# Patient Record
Sex: Male | Born: 1995 | Hispanic: Yes | Smoking: Current every day smoker
Health system: Southern US, Community
[De-identification: ages and names within clinical notes are randomized; demographics above are authoritative.]

---

## 2021-06-21 ENCOUNTER — Ambulatory Visit
Admission: EM | Admit: 2021-06-21 | Discharge: 2021-06-21 | Disposition: A | Discharge status: Home / Self Care | Payer: Self-pay | Attending: Emergency Medicine | Admitting: Emergency Medicine

## 2021-06-21 ENCOUNTER — Encounter: Payer: Self-pay | Admitting: Emergency Medicine

## 2021-06-21 ENCOUNTER — Other Ambulatory Visit: Payer: Self-pay

## 2021-06-21 ENCOUNTER — Ambulatory Visit (INDEPENDENT_AMBULATORY_CARE_PROVIDER_SITE_OTHER): Payer: Self-pay

## 2021-06-21 DIAGNOSIS — S93401A Sprain of unspecified ligament of right ankle, initial encounter: Secondary | ICD-10-CM

## 2021-06-21 DIAGNOSIS — M79671 Pain in right foot: Secondary | ICD-10-CM

## 2021-06-21 DIAGNOSIS — M25571 Pain in right ankle and joints of right foot: Secondary | ICD-10-CM

## 2021-06-21 DIAGNOSIS — W19XXXA Unspecified fall, initial encounter: Secondary | ICD-10-CM

## 2021-06-21 MED ORDER — HYDROCODONE-ACETAMINOPHEN 5-325 MG PO TABS: 1.0000 | ORAL_TABLET | Freq: Four times a day (QID) | ORAL | 0 refills | Status: AC | PRN

## 2021-06-21 MED ORDER — IBUPROFEN 600 MG PO TABS: 600.0000 mg | ORAL_TABLET | Freq: Four times a day (QID) | ORAL | 0 refills | Status: AC | PRN

## 2021-06-21 NOTE — ED Provider Notes (Signed)
[facility]    CSN: [id number] Arrival date & time: 06/21/21  1332      History   Chief Complaint Chief Complaint  Patient presents with   Ankle Pain    right    HPI Jason Haley is a 25 y.o. male.   HPI  25 year old male here for evaluation of right foot ankle pain.  Patient reports that he fell from approximately 6 to 7 feet in the air onto a concrete surface.  He landed flat on his feet but his right foot also landed on a stick which caused the patient to roll his ankle inward.  He states he heard and felt a pop, had immediate pain, and he has not been able to bear weight since.  He also feels numbness and tingling in his toes.  History reviewed. No pertinent past medical history.  There are no problems to display for this patient.   History reviewed. No pertinent surgical history.     Home Medications    Prior to Admission medications   Medication Sig Start Date End Date Taking? Authorizing Provider  HYDROcodone-acetaminophen (NORCO/VICODIN) 5-325 MG tablet Take 1-2 tablets by mouth every 6 (six) hours as needed. 06/21/21  Yes Becky Augusta, NP  ibuprofen (ADVIL) 600 MG tablet Take 1 tablet (600 mg total) by mouth every 6 (six) hours as needed. 06/21/21  Yes Becky Augusta, NP    Family History No family history on file.  Social History Social History   Tobacco Use   Smoking status: Every Day    Packs/day: 1.00    Types: Cigarettes   Smokeless tobacco: Never  Vaping Use   Vaping Use: Never used  Substance Use Topics   Alcohol use: Yes   Drug use: Not Currently     Allergies   Patient has no known allergies.   Review of Systems Review of Systems  Constitutional:  Negative for activity change, appetite change and fever.  Musculoskeletal:  Positive for arthralgias, joint swelling and myalgias.  Skin:  Negative for color change and wound.  Neurological:  Positive for numbness. Negative for weakness.  Hematological: Negative.    Psychiatric/Behavioral: Negative.      Physical Exam Triage Vital Signs ED Triage Vitals  Enc Vitals Group     BP 06/21/21 1400 138/87     Pulse Rate 06/21/21 1400 99     Resp 06/21/21 1400 18     Temp 06/21/21 1400 98.8 F (37.1 C)     Temp Source 06/21/21 1400 Oral     SpO2 06/21/21 1400 100 %     Weight 06/21/21 1358 170 lb (77.1 kg)     Height 06/21/21 1358 5\' 8"  (1.727 m)     Head Circumference --      Peak Flow --      Pain Score 06/21/21 1358 8     Pain Loc --      Pain Edu? --      Excl. in GC? --    No data found.  Updated Vital Signs BP 138/87 (BP Location: Right Arm)   Pulse 99   Temp 98.8 F (37.1 C) (Oral)   Resp 18   Ht 5\' 8"  (1.727 m)   Wt 170 lb (77.1 kg)   SpO2 100%   BMI 25.85 kg/m   Visual Acuity Right Eye Distance:   Left Eye Distance:   Bilateral Distance:    Right Eye Near:   Left Eye Near:    Bilateral Near:  Physical Exam Vitals and nursing note reviewed.  Constitutional:      General: He is not in acute distress.    Appearance: Normal appearance. He is not ill-appearing.  HENT:     Head: Normocephalic and atraumatic.  Musculoskeletal:        General: Swelling and tenderness present. No deformity.  Skin:    General: Skin is warm and dry.     Capillary Refill: Capillary refill takes less than 2 seconds.     Findings: No erythema.  Neurological:     General: No focal deficit present.     Mental Status: He is alert and oriented to person, place, and time.  Psychiatric:        Mood and Affect: Mood normal.        Behavior: Behavior normal.        Thought Content: Thought content normal.        Judgment: Judgment normal.     UC Treatments / Results  Labs (all labs ordered are listed, but only abnormal results are displayed) Labs Reviewed - No data to display  EKG   Radiology DG Ankle Complete Right  Result Date: 06/21/2021 CLINICAL DATA:  Fall.  Pain and decreased range of motion. EXAM: RIGHT ANKLE - COMPLETE 3+  VIEW COMPARISON:  None. FINDINGS: Lateral soft tissue swelling. There is no evidence of fracture, dislocation, or joint effusion. There is no evidence of arthropathy or other focal bone abnormality. IMPRESSION: Lateral soft tissue swelling.  No acute bone abnormality. Electronically Signed   By: Signa Kell M.D.   On: 06/21/2021 14:48   DG Foot Complete Right  Result Date: 06/21/2021 CLINICAL DATA:  Right foot pain EXAM: RIGHT FOOT COMPLETE - 3+ VIEW COMPARISON:  None. FINDINGS: There is no evidence of fracture or dislocation. There is no evidence of arthropathy or other focal bone abnormality. No soft tissue swelling of the foot. IMPRESSION: Negative. Electronically Signed   By: Duanne Guess D.O.   On: 06/21/2021 14:50    Procedures Procedures (including critical care time)  Medications Ordered in UC Medications - No data to display  Initial Impression / Assessment and Plan / UC Course  I have reviewed the triage vital signs and the nursing notes.  Pertinent labs & imaging results that were available during my care of the patient were reviewed by me and considered in my medical decision making (see chart for details).  Patient is a very pleasant, nontoxic-appearing 25 year old male here for evaluation of right foot ankle pain after falling 6 7 feet onto a concrete surface and rolling his right ankle.  He has been able to bear weight since then as any pressure causes extreme pain.  He states that he did hear a pop when he rolled his ankle and felt immediate pain.  Patient's physical exam reveals a left foot and ankle that are normal anatomical alignment.  There is swelling of the proximal midfoot and ankle.  The distal aspect of his foot appears dusky but they have a 2-second cap refill.  Patient states he is unable to wiggle his toes secondary to pain and is unable to dorsiflex or plantarflex his foot secondary to pain.  He has no pain with palpation of the metatarsals but he does have pain  with palpation of all the tarsal bones he similarly has pain with compression of the distal two thirds of the medial and lateral malleolus.  Patient is complaining of pain in his calcaneus and also along his Achilles  tendon.  He has no pain in his calf.  Patient does have pain with passive dorsiflexion and plantarflexion as well.  DP and PT pulses are 2+.  Right foot and ankle films collected at triage.  Right foot films independently reviewed and evaluated by me.  Impression: No evidence of fracture or dislocation.  Awaiting radiology overread. Radiology interpretation is negative for fracture.  Right ankle films independently reviewed and evaluated by me.  Impression: No evidence of fracture or dislocation.  There is soft tissue swelling over the medial and lateral malleolus.  Awaiting radiology overread. Radiology interpretation is lateral soft tissue swelling but no acute bone abnormality.  Will place patient in cam boot and crutches and have him remain nonambulatory for the next 48 hours until the swelling goes down.  After that, I will have him attempt to slowly increase weightbearing as tolerated.  If he is still having significant pain he should follow-up with orthopedics.  Patient given the number for [facility].   Final Clinical Impressions(s) / UC Diagnoses   Final diagnoses:  Sprain of right ankle, unspecified ligament, initial encounter     Discharge Instructions      Use la bota para caminar para proteger su tobillo y no aplique ningn peso sobre su pie y tobillo derechos.  [place] las muletas para desplazarte.  Mantenga el pie derecho elevado tanto como sea posible para disminuir la hinchazn y ayudar a Engineer, materials.  Aplique hielo en el pie y el tobillo derechos durante 20 minutos a la vez, 2 o 3 veces al da.  Use el Ibuprofeno 600 mg cada 6 horas con alimentos para el dolor moderado y use el Norco segn sea necesario para Occupational hygienist.  Seguimiento con ortopedia  el lunes.  Wear the walking boot to protect your ankle and do not apply any weight to your right foot and ankle.  Use the crutches to get around.  Keep your right foot elevated as much as possible to decrease swelling and aid in pain relief.  Apply ice to your right foot and ankle for 20 minutes at a time, 2-3 times a day.  Use the Ibuprofen 600 mg every 6 hours with food for moderate pain and use the Norco as needed for severe pain.  Follow-up with orthopedics on Monday.     ED Prescriptions     Medication Sig Dispense Auth. Provider   ibuprofen (ADVIL) 600 MG tablet Take 1 tablet (600 mg total) by mouth every 6 (six) hours as needed. 30 tablet Becky Augusta, NP   HYDROcodone-acetaminophen (NORCO/VICODIN) 5-325 MG tablet Take 1-2 tablets by mouth every 6 (six) hours as needed. 15 tablet Becky Augusta, NP      I have reviewed the PDMP during this encounter.   Becky Augusta, NP 06/21/21 [phone]

## 2021-06-21 NOTE — ED Triage Notes (Signed)
Pt c/o right foot and ankle pain. He states he fell from about 6-7 feet in the air. He heard a loud pop and can not put weight on his foot or move his ankle. Accident occurred about 11:00 today.

## 2021-06-21 NOTE — Discharge Instructions (Addendum)
Use la bota para caminar para proteger su tobillo y no aplique ningn peso sobre su pie y tobillo derechos.  [place] las muletas para desplazarte.  Mantenga el pie derecho elevado tanto como sea posible para disminuir la hinchazn y ayudar a Engineer, materials.  Aplique hielo en el pie y el tobillo derechos durante 20 minutos a la vez, 2 o 3 veces al da.  Use el Ibuprofeno 600 mg cada 6 horas con alimentos para el dolor moderado y use el Norco segn sea necesario para Occupational hygienist.  Seguimiento con ortopedia el lunes.  Wear the walking boot to protect your ankle and do not apply any weight to your right foot and ankle.  Use the crutches to get around.  Keep your right foot elevated as much as possible to decrease swelling and aid in pain relief.  Apply ice to your right foot and ankle for 20 minutes at a time, 2-3 times a day.  Use the Ibuprofen 600 mg every 6 hours with food for moderate pain and use the Norco as needed for severe pain.  Follow-up with orthopedics on Monday.

## 2022-03-06 IMAGING — CR DG FOOT COMPLETE 3+V*R*
3 series · 3 of 3 positions shown · non-contrast
Comparison: None.

CLINICAL DATA: Right foot pain

EXAM:
RIGHT FOOT COMPLETE - 3+ VIEW

[foot ap]
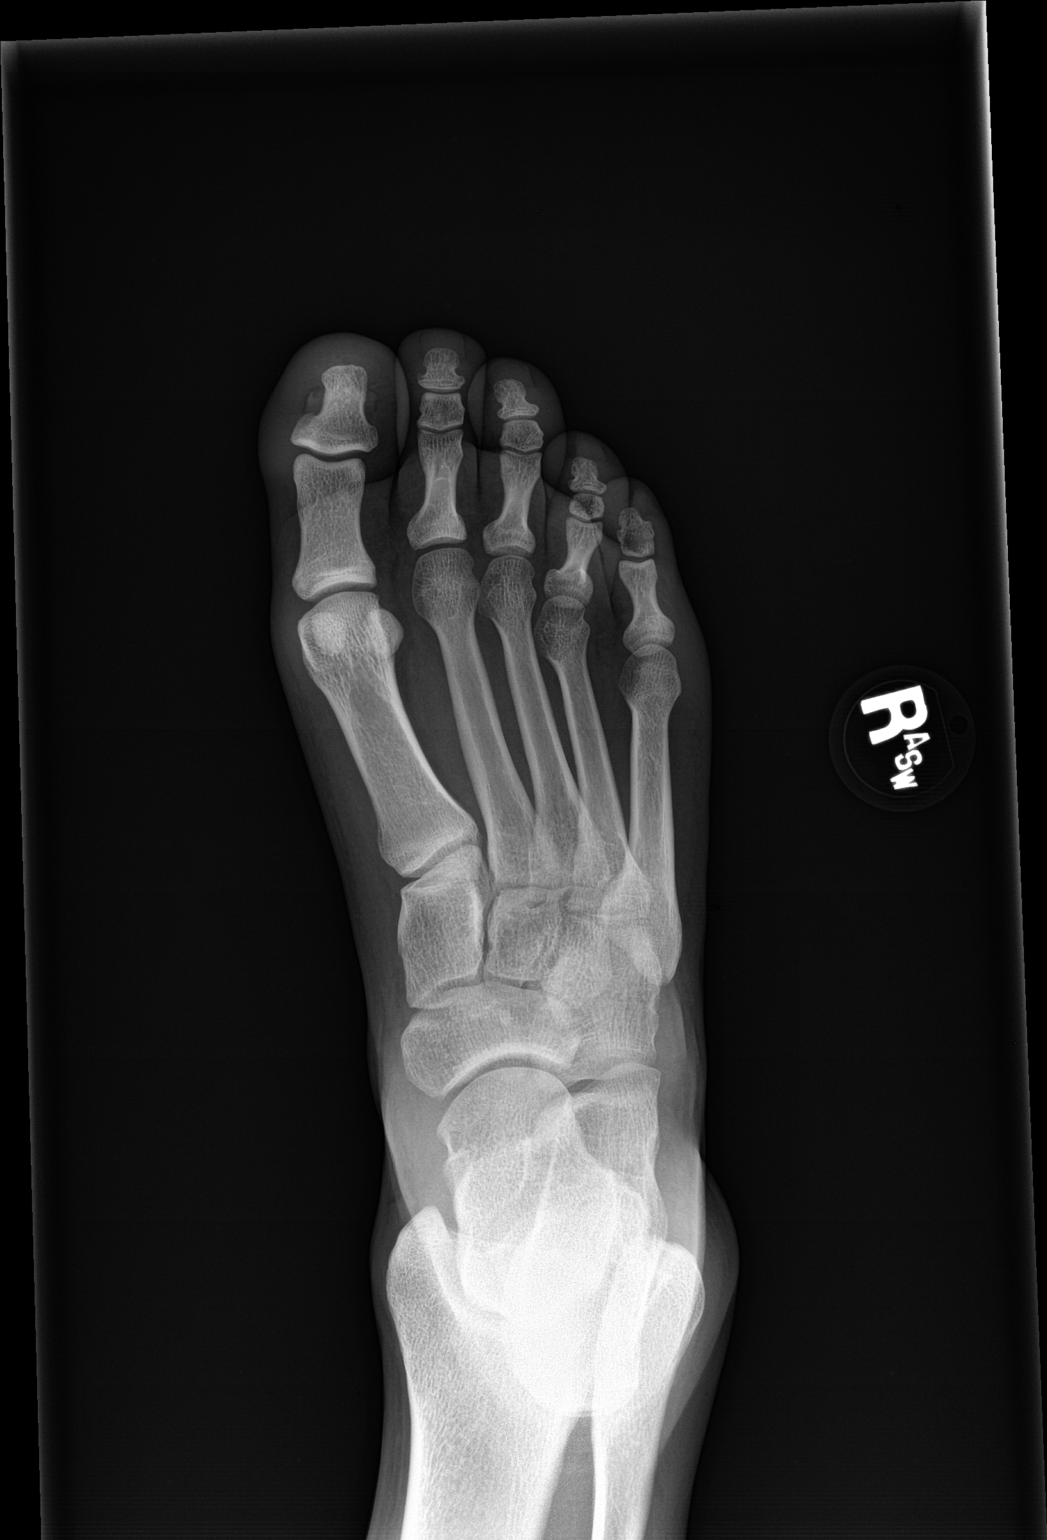

[foot obl]
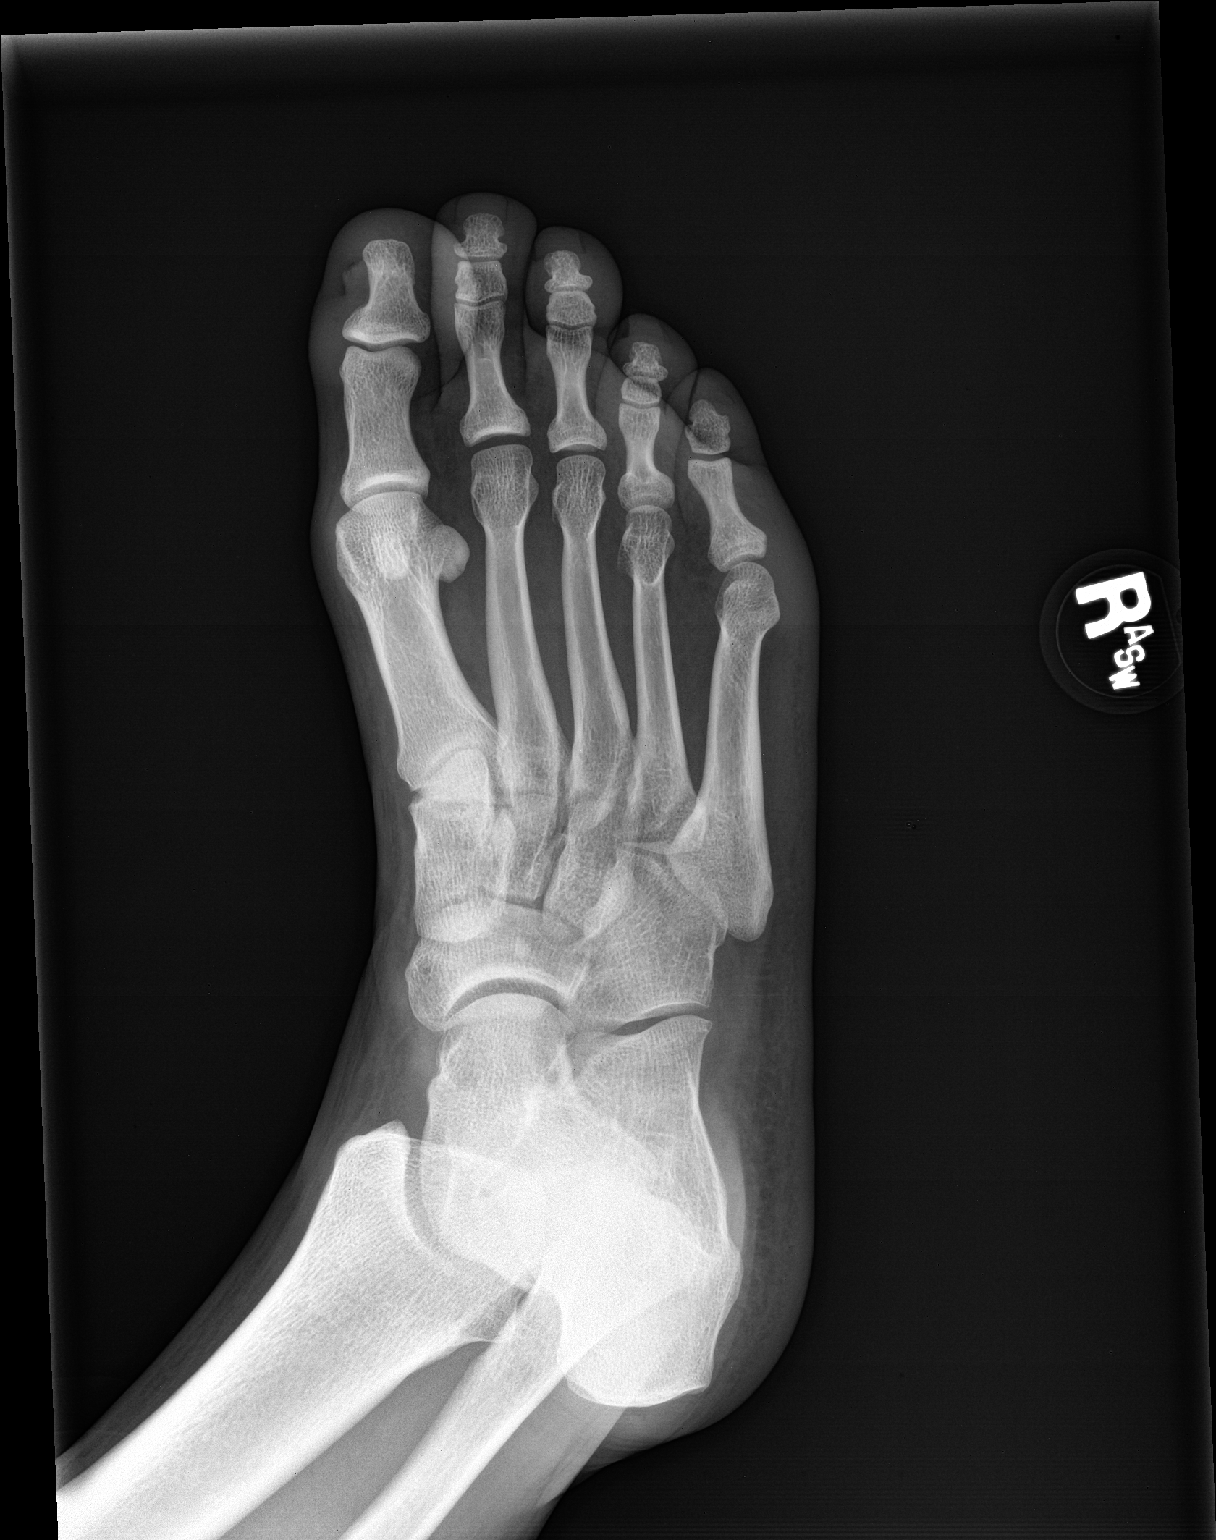

[foot lat]
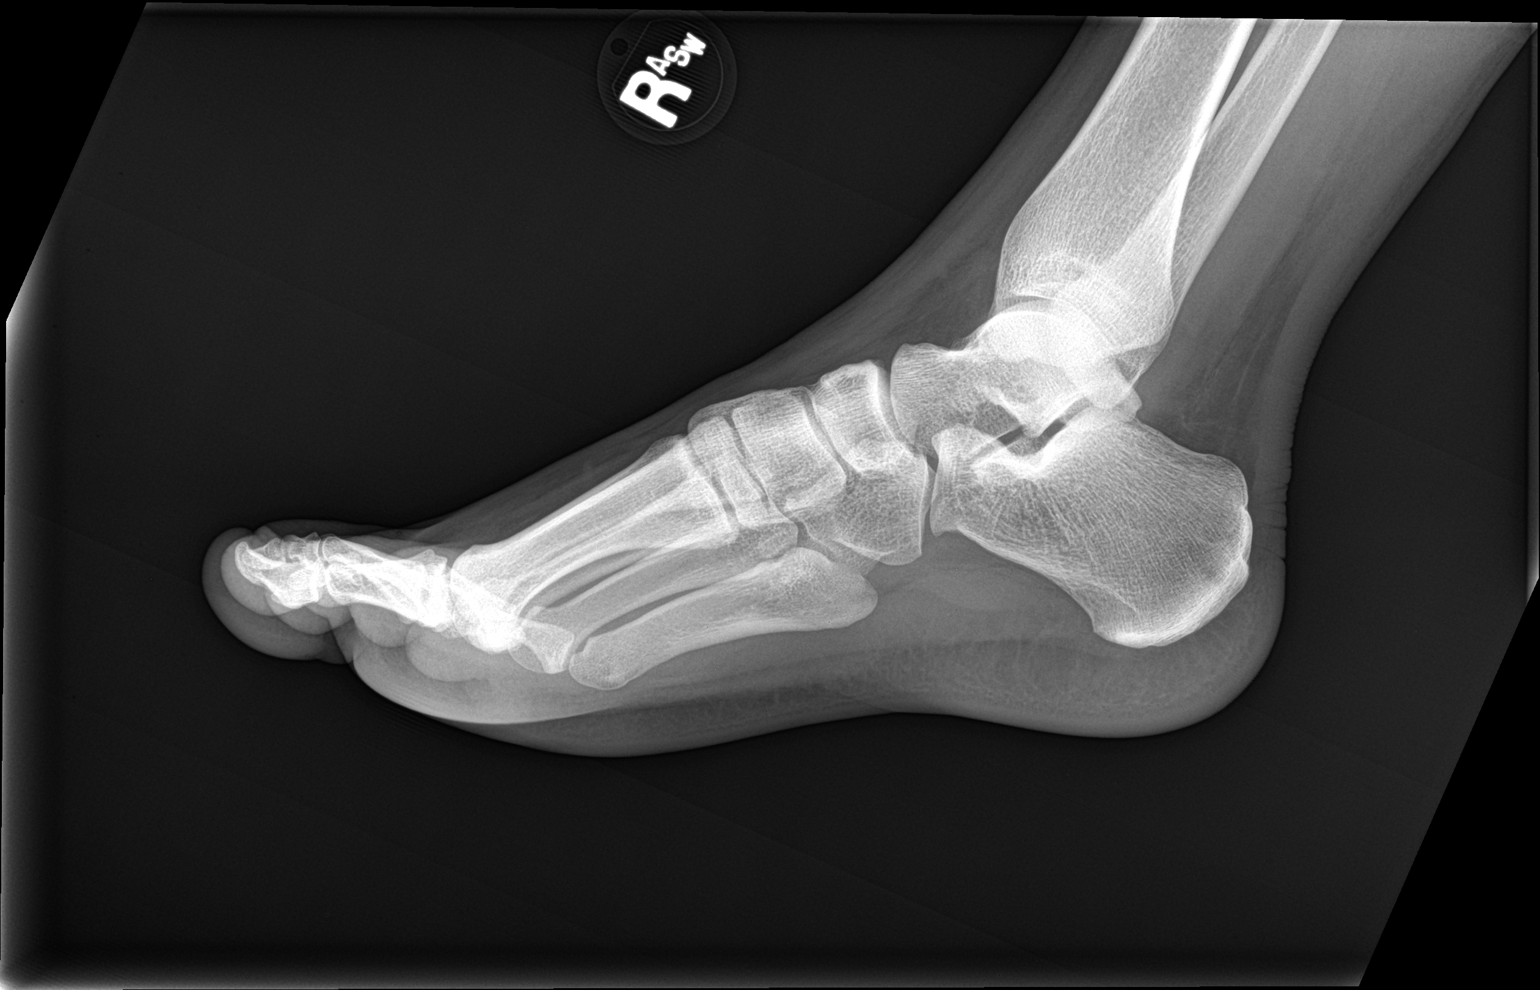

[3 of 3 positions shown; findings below may reference images not displayed]

FINDINGS: There is no evidence of fracture or dislocation. There is no
evidence of arthropathy or other focal bone abnormality. No soft
tissue swelling of the foot.
IMPRESSION: Negative.
